# Patient Record
Sex: Male | Born: 1995 | Race: Black or African American | Hispanic: No | Marital: Single | Smoking: Never smoker
Health system: Southern US, Community
[De-identification: ages and names within clinical notes are randomized; demographics above are authoritative.]

---
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
## 2015-06-29 ENCOUNTER — Encounter: Payer: Self-pay | Admitting: *Deleted

## 2015-07-08 ENCOUNTER — Encounter: Payer: Self-pay | Admitting: Obstetrics & Gynecology

## 2016-07-15 ENCOUNTER — Encounter (HOSPITAL_COMMUNITY): Payer: Self-pay | Admitting: *Deleted

## 2016-07-15 ENCOUNTER — Emergency Department (HOSPITAL_COMMUNITY)
Admission: EM | Admit: 2016-07-15 | Discharge: 2016-07-15 | Disposition: A | Discharge status: Home / Self Care | Payer: 59 | Attending: Emergency Medicine | Admitting: Emergency Medicine

## 2016-07-15 DIAGNOSIS — R05 Cough: Secondary | ICD-10-CM | POA: Diagnosis present

## 2016-07-15 DIAGNOSIS — J111 Influenza due to unidentified influenza virus with other respiratory manifestations: Secondary | ICD-10-CM | POA: Diagnosis not present

## 2016-07-15 LAB — CBC
HCT: 40.8 % (ref 39.0–52.0)
Hemoglobin: 14 g/dL (ref 13.0–17.0)
MCH: 28.6 pg (ref 26.0–34.0)
MCHC: 34.3 g/dL (ref 30.0–36.0)
MCV: 83.4 fL (ref 78.0–100.0)
PLATELETS: 230 10*3/uL (ref 150–400)
RBC: 4.89 MIL/uL (ref 4.22–5.81)
RDW: 12.7 % (ref 11.5–15.5)
WBC: 6.9 10*3/uL (ref 4.0–10.5)

## 2016-07-15 MED ORDER — ACETAMINOPHEN 325 MG PO TABS
650.0000 mg | ORAL_TABLET | Freq: Once | ORAL | Status: AC
  Administered 2016-07-15: 650 mg via ORAL
  Filled 2016-07-15: qty 2

## 2016-07-15 MED ORDER — OSELTAMIVIR PHOSPHATE 75 MG PO CAPS: 75.0000 mg | ORAL_CAPSULE | Freq: Two times a day (BID) | ORAL | 0 refills | Status: DC

## 2016-07-15 NOTE — ED Provider Notes (Signed)
[facility]EM[facility] Provider Note   CSN: [id number] Arrival date & time: 07/15/16  0203     History   Chief Complaint Chief Complaint  Patient presents with  . Influenza    HPI Alex Adkins is a 21 y.o. male.  HPI Patient presents with flulike symptoms. His girlfriend is here with similar symptoms. Has had cough for about a week but for the last 2 days started having congestion bodyaches fevers myalgias. He started sneezing a couple days ago. Patient has had some clear sputum production. States he is worried because he is also had some nosebleeds. States his nose started bleeding after coughing. He's had some blood coming out both emesis nostrils. No bleeding down the back of his throat. No other bleeding. Does not bruise easily. No dysuria. Slight chest pain on coughing. Some mild nausea without vomiting.   History reviewed. No pertinent past medical history.  There are no active problems to display for this patient.   History reviewed. No pertinent surgical history.     Home Medications    Prior to Admission medications   Medication Sig Start Date End Date Taking? Authorizing Provider  azithromycin (ZITHROMAX) 250 MG tablet Take two tablets on day one, then one tab qd days 2-5 05/12/11   Tammy Triplett, PA-C  chlorpheniramine-HYDROcodone (TUSSIONEX PENNKINETIC ER) 10-8 MG/5ML LQCR Take 5 mLs by mouth every 12 (twelve) hours as needed. 05/11/11   Tammy Triplett, PA-C  oseltamivir (TAMIFLU) 75 MG capsule Take 1 capsule (75 mg total) by mouth every 12 (twelve) hours. 07/15/16   Benjiman CoreNathan Barton Want, MD    Family History History reviewed. No pertinent family history.  Social History Social History  Substance Use Topics  . Smoking status: Never Smoker  . Smokeless tobacco: Never Used  . Alcohol use No     Allergies   Patient has no known allergies.   Review of Systems Review of Systems  Constitutional: Positive for appetite change, chills and fever.  HENT:  Positive for congestion and nosebleeds.   Eyes: Negative for photophobia and visual disturbance.  Respiratory: Positive for cough.   Cardiovascular: Positive for chest pain.  Gastrointestinal: Negative for abdominal pain.  Endocrine: Negative for polyuria.  Genitourinary: Negative for difficulty urinating.  Musculoskeletal: Positive for myalgias. Negative for back pain.  Neurological: Negative for light-headedness.  Hematological: Does not bruise/bleed easily.  Psychiatric/Behavioral: Negative for agitation.  All other systems reviewed and are negative.    Physical Exam Updated Vital Signs BP 124/86 (BP Location: Left Arm)   Pulse 105   Temp 100.5 F (38.1 C) (Oral)   Resp 18   Ht 5\' 5"  (1.651 m)   Wt 150 lb (68 kg)   SpO2 97%   BMI 24.96 kg/m   Physical Exam  Constitutional: He appears well-developed.  HENT:  Head: Atraumatic.  Mouth/Throat: No oropharyngeal exudate.  Submucosal edema and bilateral nostrils. Some mild erythema with some evidence of previous leading. No active bleeding seen.  Eyes: EOM are normal.  Neck: Neck supple.  Cardiovascular: Normal rate.   Pulmonary/Chest: Effort normal.  Abdominal: Soft. There is no tenderness.  Musculoskeletal: Normal range of motion.  Neurological: He is alert.  Skin: Skin is warm.     ED Treatments / Results  Labs (all labs ordered are listed, but only abnormal results are displayed) Labs Reviewed  CBC    EKG  EKG Interpretation None       Radiology No results found.  Procedures Procedures (including critical care time)  Medications  Ordered in ED Medications - No data to display   Initial Impression / Assessment and Plan / ED Course  I have reviewed the triage vital signs and the nursing notes.  Pertinent labs & imaging results that were available during my care of the patient were reviewed by me and considered in my medical decision making (see chart for details).     Patient with likely flu.  Had nosebleed and has normal platelets. No active bleeding. Lungs are clear. Doubt pneumonia. Patient's girlfriend is here with similar symptoms. Discussed Tamiflu with the patient. He has had more severe symptoms for 2 days and potentially is a candidate. He is not sure and was given a prescription and will fill it if he desires take the medicine.  Final Clinical Impressions(s) / ED Diagnoses   Final diagnoses:  Influenza    New Prescriptions New Prescriptions   OSELTAMIVIR (TAMIFLU) 75 MG CAPSULE    Take 1 capsule (75 mg total) by mouth every 12 (twelve) hours.     Benjiman Core, MD 07/15/16 [phone]

## 2016-07-15 NOTE — ED Triage Notes (Signed)
Pt reports headache, cough, congestion, fever, body aches, sneezing (pt states that when he sneezes his nose bleed a little). Symptoms started last night.

## 2020-01-15 ENCOUNTER — Ambulatory Visit: Payer: 59 | Admitting: Internal Medicine

## 2023-08-29 ENCOUNTER — Encounter (HOSPITAL_COMMUNITY): Payer: Self-pay | Admitting: Emergency Medicine

## 2023-08-29 ENCOUNTER — Emergency Department (HOSPITAL_COMMUNITY): Admission: EM | Admit: 2023-08-29 | Discharge: 2023-08-29 | Disposition: A | Discharge status: Home / Self Care

## 2023-08-29 ENCOUNTER — Emergency Department (HOSPITAL_COMMUNITY)

## 2023-08-29 ENCOUNTER — Other Ambulatory Visit: Payer: Self-pay

## 2023-08-29 DIAGNOSIS — S6992XA Unspecified injury of left wrist, hand and finger(s), initial encounter: Secondary | ICD-10-CM | POA: Diagnosis present

## 2023-08-29 DIAGNOSIS — S8392XA Sprain of unspecified site of left knee, initial encounter: Secondary | ICD-10-CM | POA: Insufficient documentation

## 2023-08-29 DIAGNOSIS — Z23 Encounter for immunization: Secondary | ICD-10-CM | POA: Diagnosis not present

## 2023-08-29 DIAGNOSIS — S62232A Other displaced fracture of base of first metacarpal bone, left hand, initial encounter for closed fracture: Secondary | ICD-10-CM | POA: Insufficient documentation

## 2023-08-29 MED ORDER — CEPHALEXIN 500 MG PO CAPS: 500.0000 mg | ORAL_CAPSULE | Freq: Four times a day (QID) | ORAL | 0 refills | Status: AC

## 2023-08-29 MED ORDER — NAPROXEN 500 MG PO TABS: 500.0000 mg | ORAL_TABLET | Freq: Two times a day (BID) | ORAL | 0 refills | Status: AC

## 2023-08-29 MED ORDER — TETANUS-DIPHTH-ACELL PERTUSSIS 5-2.5-18.5 LF-MCG/0.5 IM SUSY
0.5000 mL | PREFILLED_SYRINGE | Freq: Once | INTRAMUSCULAR | Status: AC
Start: 2023-08-29 — End: 2023-08-29
  Administered 2023-08-29: 0.5 mL via INTRAMUSCULAR
  Filled 2023-08-29: qty 0.5

## 2023-08-29 MED ORDER — DOUBLE ANTIBIOTIC 500-10000 UNIT/GM EX OINT
TOPICAL_OINTMENT | Freq: Once | CUTANEOUS | Status: AC
  Administered 2023-08-29: 1 via TOPICAL
  Filled 2023-08-29: qty 1

## 2023-08-29 NOTE — Discharge Instructions (Signed)
 Splint in place until evaluated by orthopedics.  Please call to make an appointment with the orthopedic provider.  Return to the emergency department immediately for any new or worsening symptoms.

## 2023-08-29 NOTE — ED Notes (Signed)
 ACE wrap applied to left knee
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
## 2023-08-30 ENCOUNTER — Telehealth: Payer: Self-pay | Admitting: Orthopedic Surgery

## 2023-08-30 NOTE — Telephone Encounter (Signed)
-----   Message from Ellis Savage sent at 08/30/2023  9:27 AM EDT ----- Can you add this patient on for Monday morning. We are talking about doing surgery possibly Tuesday at [facility] if possible. ----- Message ----- From: Samuella Cota, MD Sent: 08/29/2023   8:31 PM EDT To: Vickki Hearing, MD; Ellis Savage, LAT  Based on XR could be some rotation to fracture and intra-articular involvment I can see in surgical consult, can try and get him in with me early next week ----- Message ----- From: Vickki Hearing, MD Sent: 08/29/2023   1:09 PM EDT To: Samuella Cota, MD  Hi Dr Fara Boros   Would you take this as a consult for surgery ? Or should I apply a cast

## 2023-08-30 NOTE — Telephone Encounter (Signed)
 Called patient amd scheduled an appointment for Monday 3/31 with Dr. Denese Killings.

## 2023-09-02 ENCOUNTER — Ambulatory Visit: Admitting: Orthopedic Surgery

## 2023-09-02 ENCOUNTER — Ambulatory Visit (INDEPENDENT_AMBULATORY_CARE_PROVIDER_SITE_OTHER): Admitting: Orthopedic Surgery

## 2023-09-02 ENCOUNTER — Other Ambulatory Visit (INDEPENDENT_AMBULATORY_CARE_PROVIDER_SITE_OTHER): Payer: Self-pay

## 2023-09-02 DIAGNOSIS — S62212A Bennett's fracture, left hand, initial encounter for closed fracture: Secondary | ICD-10-CM | POA: Diagnosis not present

## 2023-09-02 DIAGNOSIS — M79645 Pain in left finger(s): Secondary | ICD-10-CM
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
## 2023-09-02 NOTE — Addendum Note (Signed)
 Addended by: Samuella Cota on: 09/02/2023 02:53 PM   Modules accepted: Level of Service

## 2023-09-03 ENCOUNTER — Other Ambulatory Visit: Payer: Self-pay | Admitting: Orthopedic Surgery

## 2023-09-03 DIAGNOSIS — S62242A Displaced fracture of shaft of first metacarpal bone, left hand, initial encounter for closed fracture: Secondary | ICD-10-CM | POA: Diagnosis not present

## 2023-09-03 MED ORDER — OXYCODONE HCL 5 MG PO TABS: 5.0000 mg | ORAL_TABLET | Freq: Four times a day (QID) | ORAL | 0 refills | Status: AC | PRN

## 2023-09-05 ENCOUNTER — Ambulatory Visit: Admitting: Orthopedic Surgery

## 2023-09-12 NOTE — Progress Notes (Unsigned)
  Intake history:  There were no vitals taken for this visit. There is no height or weight on file to calculate BMI.    WHAT ARE WE SEEING YOU FOR TODAY?   {LEFT/RIGHT/BI:30031} knee(s)/ Has seen Dr Fara Boros for the Hand/ thumb pain   How long has this bothered you? (DOI?DOS?WS?)  on 09/02/23   Anticoag.  No  Diabetes No  Heart disease No  Hypertension No  SMOKING HX No  Kidney disease No  Any ALLERGIES ______________________________________________   Treatment:  Have you taken:  Tylenol {yes/no:20286}  Advil {yes/no:20286}  Had PT {yes/no:20286}  Had injection {yes/no:20286}  Other  _________________________

## 2023-09-13 ENCOUNTER — Ambulatory Visit: Admitting: Orthopedic Surgery

## 2023-09-13 ENCOUNTER — Encounter: Payer: Self-pay | Admitting: Orthopedic Surgery

## 2023-09-13 DIAGNOSIS — M25562 Pain in left knee: Secondary | ICD-10-CM

## 2023-09-13 DIAGNOSIS — M25662 Stiffness of left knee, not elsewhere classified: Secondary | ICD-10-CM
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
## 2023-09-13 NOTE — Patient Instructions (Signed)
 Physical therapy has been ordered for you at [facility]. They should call you to schedule, [phone] is the phone number to call, if you want to call to schedule.

## 2023-09-16 ENCOUNTER — Other Ambulatory Visit: Payer: Self-pay

## 2023-09-16 ENCOUNTER — Ambulatory Visit: Admitting: Orthopedic Surgery

## 2023-09-16 ENCOUNTER — Other Ambulatory Visit (INDEPENDENT_AMBULATORY_CARE_PROVIDER_SITE_OTHER): Payer: Self-pay

## 2023-09-16 ENCOUNTER — Other Ambulatory Visit: Payer: Self-pay | Admitting: Orthopedic Surgery

## 2023-09-16 DIAGNOSIS — S62212A Bennett's fracture, left hand, initial encounter for closed fracture: Secondary | ICD-10-CM

## 2023-09-16 DIAGNOSIS — M79645 Pain in left finger(s): Secondary | ICD-10-CM
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
## 2023-10-01 ENCOUNTER — Ambulatory Visit (INDEPENDENT_AMBULATORY_CARE_PROVIDER_SITE_OTHER): Payer: Self-pay

## 2023-10-01 ENCOUNTER — Other Ambulatory Visit: Payer: Self-pay

## 2023-10-01 ENCOUNTER — Ambulatory Visit (INDEPENDENT_AMBULATORY_CARE_PROVIDER_SITE_OTHER): Admitting: Orthopedic Surgery

## 2023-10-01 ENCOUNTER — Encounter: Payer: Self-pay | Admitting: Rehabilitative and Restorative Service Providers"

## 2023-10-01 ENCOUNTER — Ambulatory Visit (INDEPENDENT_AMBULATORY_CARE_PROVIDER_SITE_OTHER): Admitting: Rehabilitative and Restorative Service Providers"

## 2023-10-01 DIAGNOSIS — S62212A Bennett's fracture, left hand, initial encounter for closed fracture: Secondary | ICD-10-CM | POA: Diagnosis not present

## 2023-10-01 DIAGNOSIS — M79642 Pain in left hand: Secondary | ICD-10-CM | POA: Diagnosis not present

## 2023-10-01 DIAGNOSIS — M25642 Stiffness of left hand, not elsewhere classified: Secondary | ICD-10-CM | POA: Diagnosis not present

## 2023-10-01 DIAGNOSIS — M79645 Pain in left finger(s): Secondary | ICD-10-CM | POA: Diagnosis not present

## 2023-10-01 DIAGNOSIS — M6281 Muscle weakness (generalized): Secondary | ICD-10-CM

## 2023-10-01 DIAGNOSIS — R600 Localized edema: Secondary | ICD-10-CM | POA: Diagnosis not present

## 2023-10-01 DIAGNOSIS — R278 Other lack of coordination: Secondary | ICD-10-CM

## 2023-10-01 NOTE — Progress Notes (Signed)
   Alex Adkins - 28 y.o. male MRN [id number]  Date of birth: [birth year]  Office Visit Note: Visit Date: 10/01/2023 PCP: Patient, No Pcp Per Referred by: No ref. provider found  Subjective:  HPI: Teo Weisel Oetting is a 28 y.o. male who presents today for follow up 4 weeks  status post left thumb metacarpal base fracture with intra-articular involvement, closed reduction and percutaneous pinning.  He is doing well overall, pain is controlled.  Pertinent ROS were reviewed with the patient and found to be negative unless otherwise specified above in HPI.   Assessment & Plan: Visit Diagnoses:  1. Closed Bennett's fracture of left thumb, initial encounter   2. Thumb pain, left     Plan: He is doing well overall, x-rays today demonstrate stable appearance of the thumb metacarpal fracture with appropriate interval healing.  Pins were removed today without incident.  He was seen by Occupational Therapy after his visit today for fabrication of an orthosis to protect the fracture, IP joint will remain free at the thumb to allow for motion.  I have emphasized the importance of protection for an additional 2 weeks given the comminution and intra-articular involvement of the fracture.  He expressed full understanding.  Will return in 2 weeks for repeat clinical and radiographic check.  Follow-up: Return in about 2 weeks (around 10/15/2023).   Meds & Orders: No orders of the defined types were placed in this encounter.   Orders Placed This Encounter  Procedures   XR Hand Complete Left     Procedures: No procedures performed       Objective:   Vital Signs: There were no vitals taken for this visit.  Ortho Exam Left hand: - Pins Removed today without incident, pin sites remain clean dry and intact - Sensation intact distally throughout the thumb, thumb remains warm and well-perfused - Able to fire IP flexion of the thumb, index FDP 5/5, interosseous intact  Imaging: XR Hand Complete  Left Result Date: 10/01/2023 X-rays of the left hand demonstrate stable appearance of the thumb metacarpal fracture without significant interval displacement    Christine Morton Merlinda Starling) Waylon Hershey, M.D. [facility], Hand Surgery

## 2023-10-08 ENCOUNTER — Telehealth: Payer: Self-pay | Admitting: Rehabilitative and Restorative Service Providers"

## 2023-10-08 ENCOUNTER — Encounter: Admitting: Rehabilitative and Restorative Service Providers"

## 2023-10-08 NOTE — Telephone Encounter (Signed)
 OT called patient to discuss missed no-show appointment. OT spoke with pt about next appointment date/time. He was asked to come at 8am for doc f/u next Tues, and then come up to scheduled therapy appt.  He states "it's fine, I don't care, man." And had apparently just woken up.  They were reminded of the attendance policy and future missed appointments could lead to early discharge from therapy.

## 2023-10-08 NOTE — Therapy (Incomplete)
 OUTPATIENT PHYSICAL THERAPY LOWER EXTREMITY EVALUATION   Patient Name: Alex Adkins MRN: [id number] DOB:1996/05/20, 28 y.o., male Today's Date: 10/08/2023  END OF SESSION:   No past medical history on file. No past surgical history on file. There are no active problems to display for this patient.   PCP: ***  REFERRING PROVIDER: Darrin Emerald, MD  REFERRING [phone] (ICD-10-CM) - Knee stiffness, left M25.562 (ICD-10-CM) - Acute pain of left knee  THERAPY DIAG:  No diagnosis found.  Rationale for Evaluation and Treatment: Rehabilitation  ONSET DATE: ***  SUBJECTIVE:   SUBJECTIVE STATEMENT: ***  PERTINENT HISTORY: *** PAIN:  Are you having pain? {OPRCPAIN:27236}  PRECAUTIONS: {Therapy precautions:24002}  RED FLAGS: {PT Red Flags:29287}   WEIGHT BEARING RESTRICTIONS: {Yes ***/No:24003}  FALLS:  Has patient fallen in last 6 months? {fallsyesno:27318}  LIVING ENVIRONMENT: Lives with: {OPRC lives with:25569::"lives with their family"} Lives in: {Lives in:25570} Stairs: {opstairs:27293} Has following equipment at home: {Assistive devices:23999}  OCCUPATION: ***  PLOF: {PLOF:24004}  PATIENT GOALS: ***  NEXT MD VISIT: ***  OBJECTIVE:  Note: Objective measures were completed at Evaluation unless otherwise noted.  DIAGNOSTIC FINDINGS: ***  PATIENT SURVEYS:  {rehab surveys:24030}  COGNITION: Overall cognitive status: {cognition:24006}     SENSATION: {sensation:27233}  EDEMA:  {edema:24020}  MUSCLE LENGTH: Hamstrings: Right *** deg; Left *** deg Andy Bannister test: Right *** deg; Left *** deg  POSTURE: {posture:25561}  PALPATION: ***  LOWER EXTREMITY ROM:  {AROM/PROM:27142} ROM Right eval Left eval  Hip flexion    Hip extension    Hip abduction    Hip adduction    Hip internal rotation    Hip external rotation    Knee flexion    Knee extension    Ankle dorsiflexion    Ankle plantarflexion    Ankle inversion    Ankle  eversion     (Blank rows = not tested)  LOWER EXTREMITY MMT:  MMT Right eval Left eval  Hip flexion    Hip extension    Hip abduction    Hip adduction    Hip internal rotation    Hip external rotation    Knee flexion    Knee extension    Ankle dorsiflexion    Ankle plantarflexion    Ankle inversion    Ankle eversion     (Blank rows = not tested)  LOWER EXTREMITY SPECIAL TESTS:  {LEspecialtests:26242}  FUNCTIONAL TESTS:  {Functional tests:24029}  GAIT: Distance walked: *** Assistive device utilized: {Assistive devices:23999} Level of assistance: {Levels of assistance:24026} Comments: ***                                                                                                                                TREATMENT DATE: 10/09/23 physical therapy evaluation and HEP instruction    PATIENT EDUCATION:  Education details: Patient educated on exam findings, POC, scope of PT, HEP, and ***. Person educated: Patient Education method: Explanation, Demonstration, and Handouts  Education comprehension: verbalized understanding, returned demonstration, verbal cues required, and tactile cues required  HOME EXERCISE PROGRAM: ***  ASSESSMENT:  CLINICAL IMPRESSION: Patient is a 28  y.o. male who was seen today for physical therapy evaluation and treatment for M25.662 (ICD-10-CM) - Knee stiffness, left M25.562 (ICD-10-CM) - Acute pain of left knee.  Patient demonstrates muscle weakness, reduced ROM, and fascial restrictions which are likely contributing to symptoms of pain and are negatively impacting patient ability to perform ADLs and functional mobility tasks. Patient will benefit from skilled physical therapy services to address these deficits to reduce pain and improve level of function with ADLs and functional mobility tasks.   OBJECTIVE IMPAIRMENTS: {opptimpairments:25111}.   ACTIVITY LIMITATIONS: {activitylimitations:27494}  PARTICIPATION LIMITATIONS:  {participationrestrictions:25113}  PERSONAL FACTORS: {Personal factors:25162} are also affecting patient's functional outcome.   REHAB POTENTIAL: Good  CLINICAL DECISION MAKING: Evolving/moderate complexity  EVALUATION COMPLEXITY: Moderate   GOALS: Goals reviewed with patient? No  SHORT TERM GOALS: Target date: *** *** Baseline: Goal status: INITIAL  2.  *** Baseline:  Goal status: INITIAL  3.  *** Baseline:  Goal status: INITIAL  4.  *** Baseline:  Goal status: INITIAL  5.  *** Baseline:  Goal status: INITIAL  6.  *** Baseline:  Goal status: INITIAL  LONG TERM GOALS: Target date: ***  *** Baseline:  Goal status: INITIAL  2.  *** Baseline:  Goal status: INITIAL  3.  *** Baseline:  Goal status: INITIAL  4.  *** Baseline:  Goal status: INITIAL  5.  *** Baseline:  Goal status: INITIAL  6.  *** Baseline:  Goal status: INITIAL   PLAN:  PT FREQUENCY: {rehab frequency:25116}  PT DURATION: {rehab duration:25117}  PLANNED INTERVENTIONS: 97164- PT Re-evaluation, 97110-Therapeutic exercises, 97530- Therapeutic activity, 97112- Neuromuscular re-education, 97535- Self Care, 16109- Manual therapy, [id number]- Gait training, [phone]- Orthotic Fit/training, [id number]- Canalith repositioning, [id number]- Aquatic Therapy, [id number]- Splinting, Patient/Family education, Balance training, Stair training, Taping, Dry Needling, Joint mobilization, Joint manipulation, Spinal manipulation, Spinal mobilization, Scar mobilization, and DME instructions.   PLAN FOR NEXT SESSION: Review HEP and goals   1:00 PM, 10/08/23 Reshard Guillet Small Emrah Ariola MPT [facility] [place] [phone]

## 2023-10-09 ENCOUNTER — Ambulatory Visit (HOSPITAL_COMMUNITY): Attending: Orthopedic Surgery
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
## 2023-10-15 ENCOUNTER — Other Ambulatory Visit (INDEPENDENT_AMBULATORY_CARE_PROVIDER_SITE_OTHER): Payer: Self-pay

## 2023-10-15 ENCOUNTER — Encounter: Admitting: Rehabilitative and Restorative Service Providers"

## 2023-10-15 ENCOUNTER — Ambulatory Visit (INDEPENDENT_AMBULATORY_CARE_PROVIDER_SITE_OTHER): Admitting: Orthopedic Surgery

## 2023-10-15 DIAGNOSIS — S62212A Bennett's fracture, left hand, initial encounter for closed fracture: Secondary | ICD-10-CM

## 2023-10-15 DIAGNOSIS — M79645 Pain in left finger(s): Secondary | ICD-10-CM | POA: Diagnosis not present

## 2023-10-15 NOTE — Progress Notes (Signed)
   Alex Adkins - 28 y.o. male MRN [id number]  Date of birth: 10-21-1995  Office Visit Note: Visit Date: 10/15/2023 PCP: Patient, No Pcp Per Referred by: No ref. provider found  Subjective:  HPI: Alex Adkins is a 28 y.o. male who presents today for follow up 6 weeks status post left thumb base metacarpal fracture with intra-articular involvement, closed reduction and percutaneous pinning.  He is doing well overall, pain is controlled, is progressing with range of motion.  Pertinent ROS were reviewed with the patient and found to be negative unless otherwise specified above in HPI.   Assessment & Plan: Visit Diagnoses:  1. Thumb pain, left   2. Closed Bennett's fracture of left thumb, initial encounter     Plan: Repeat x-rays were obtained today which show stable appearance of the thumb metacarpal base fracture with appropriate interval healing.  Slight step-off at the joint line given the notable comminution, however clinically he has appropriate range of motion without pain or crepitus at the thumb [facility] region.  At this juncture, we will continue with active range of motion with occupational therapy for additional 2 weeks.  Progressive strengthening at week 8.  I will plan on following up with him in approximate 4 weeks.  Follow-up: No follow-ups on file.   Meds & Orders: No orders of the defined types were placed in this encounter.   Orders Placed This Encounter  Procedures   XR Hand Complete Left     Procedures: No procedures performed       Objective:   Vital Signs: There were no vitals taken for this visit.  Ortho Exam Left hand: - Thumb opposition to the small finger PIP without significant pain or restriction - Thumb circumduction without pain, no significant crepitus, minimal tenderness over the thumb CMC region - Sensation intact distally to the thumb with light touch, capillary refill is appropriate  Imaging: XR Hand Complete Left Result Date:  10/15/2023 X-rays of the left thumb demonstrate interval healing of the thumb metacarpal fracture with notable articular involvement at the base.  Slight joint step-off, approximately 2 mm is appreciated with radial sided fragment at the base.  Thumb CMC joint remains located in all planes.    Landry Kamath Alvia Jointer, M.D. [facility], Hand Surgery
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
## 2023-10-22 ENCOUNTER — Encounter: Payer: Self-pay | Admitting: Rehabilitative and Restorative Service Providers"

## 2023-10-22 ENCOUNTER — Ambulatory Visit (INDEPENDENT_AMBULATORY_CARE_PROVIDER_SITE_OTHER): Admitting: Rehabilitative and Restorative Service Providers"

## 2023-10-22 DIAGNOSIS — M6281 Muscle weakness (generalized): Secondary | ICD-10-CM

## 2023-10-22 DIAGNOSIS — M79642 Pain in left hand: Secondary | ICD-10-CM | POA: Diagnosis not present

## 2023-10-22 DIAGNOSIS — R278 Other lack of coordination: Secondary | ICD-10-CM

## 2023-10-22 DIAGNOSIS — M25642 Stiffness of left hand, not elsewhere classified: Secondary | ICD-10-CM | POA: Diagnosis not present

## 2023-10-22 DIAGNOSIS — R600 Localized edema: Secondary | ICD-10-CM
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
## 2023-10-29 ENCOUNTER — Encounter: Admitting: Rehabilitative and Restorative Service Providers"
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
## 2023-11-05 ENCOUNTER — Telehealth: Payer: Self-pay | Admitting: Rehabilitative and Restorative Service Providers"

## 2023-11-05 ENCOUNTER — Encounter: Admitting: Rehabilitative and Restorative Service Providers"

## 2023-11-05 NOTE — Telephone Encounter (Signed)
 Patient called to discuss no-show appointment.  This is his third appointment that he has not showing up for and not called, so he is discharged from therapy at this time.  He was told to follow-up with his surgeon as recommended, and if he needs any additional therapy services he can get another order and begin the therapy process again in the future.  OT had to leave a message today as he did not answer his phone.

## 2023-11-12 ENCOUNTER — Encounter: Admitting: Rehabilitative and Restorative Service Providers"

## 2023-11-12 ENCOUNTER — Encounter: Admitting: Orthopedic Surgery

## 2023-12-22 ENCOUNTER — Telehealth: Payer: Self-pay | Admitting: Orthopedic Surgery

## 2023-12-22 NOTE — Telephone Encounter (Signed)
 We have attempted to reach this patient multiple times to arrange follow up as scheduled.  Our goal was to ensure appropriate healing both clinically and radiographically for adequate return to function.   OT has also been unsuccessful in reaching patient and has discharged from care currently.    Posey Jasmin, MD

## 2024-01-06 ENCOUNTER — Ambulatory Visit: Admitting: Orthopedic Surgery
